# Patient Record
Sex: Female | Born: 1955 | Race: White | Hispanic: No | Smoking: Current every day smoker
Health system: Southern US, Community
[De-identification: ages and names within clinical notes are randomized; demographics above are authoritative.]

## PROBLEM LIST (undated history)

## (undated) DIAGNOSIS — M1990 Unspecified osteoarthritis, unspecified site: Secondary | ICD-10-CM

## (undated) HISTORY — PX: NO PAST SURGERIES: SHX2092

---

## 2013-03-10 ENCOUNTER — Emergency Department (HOSPITAL_COMMUNITY): Payer: 59

## 2013-03-10 ENCOUNTER — Observation Stay (HOSPITAL_COMMUNITY)
Admission: EM | Admit: 2013-03-10 | Discharge: 2013-03-11 | Disposition: A | Discharge status: Home / Self Care | Payer: 59 | Attending: Orthopaedic Surgery | Admitting: Orthopaedic Surgery

## 2013-03-10 ENCOUNTER — Encounter (HOSPITAL_COMMUNITY): Payer: Self-pay | Admitting: Emergency Medicine

## 2013-03-10 DIAGNOSIS — IMO0002 Reserved for concepts with insufficient information to code with codable children: Secondary | ICD-10-CM | POA: Insufficient documentation

## 2013-03-10 DIAGNOSIS — S82892A Other fracture of left lower leg, initial encounter for closed fracture: Secondary | ICD-10-CM | POA: Diagnosis present

## 2013-03-10 DIAGNOSIS — Y990 Civilian activity done for income or pay: Secondary | ICD-10-CM | POA: Insufficient documentation

## 2013-03-10 DIAGNOSIS — W010XXA Fall on same level from slipping, tripping and stumbling without subsequent striking against object, initial encounter: Secondary | ICD-10-CM | POA: Insufficient documentation

## 2013-03-10 DIAGNOSIS — F17200 Nicotine dependence, unspecified, uncomplicated: Secondary | ICD-10-CM | POA: Insufficient documentation

## 2013-03-10 DIAGNOSIS — S82843A Displaced bimalleolar fracture of unspecified lower leg, initial encounter for closed fracture: Principal | ICD-10-CM | POA: Insufficient documentation

## 2013-03-10 DIAGNOSIS — Y9289 Other specified places as the place of occurrence of the external cause: Secondary | ICD-10-CM | POA: Insufficient documentation

## 2013-03-10 HISTORY — DX: Unspecified osteoarthritis, unspecified site: M19.90

## 2013-03-10 MED ORDER — MORPHINE SULFATE 4 MG/ML IJ SOLN
6.0000 mg | Freq: Once | INTRAMUSCULAR | Status: AC
  Administered 2013-03-10: 6 mg via INTRAVENOUS

## 2013-03-10 MED ORDER — DIPHENHYDRAMINE HCL 25 MG PO CAPS: 25.0000 mg | ORAL_CAPSULE | Freq: Four times a day (QID) | ORAL | Status: DC | PRN

## 2013-03-10 MED ORDER — ETOMIDATE 2 MG/ML IV SOLN
INTRAVENOUS | Status: AC | PRN
  Administered 2013-03-10: 10 mg via INTRAVENOUS

## 2013-03-10 MED ORDER — ONDANSETRON HCL 4 MG/2ML IJ SOLN
INTRAMUSCULAR | Status: AC
  Administered 2013-03-10: 4 mg via INTRAVENOUS
  Filled 2013-03-10: qty 2

## 2013-03-10 MED ORDER — METHOCARBAMOL 500 MG PO TABS: 500.0000 mg | ORAL_TABLET | Freq: Three times a day (TID) | ORAL | Status: DC | PRN

## 2013-03-10 MED ORDER — MORPHINE SULFATE 4 MG/ML IJ SOLN
6.0000 mg | Freq: Once | INTRAMUSCULAR | Status: AC
  Administered 2013-03-10: 6 mg via INTRAVENOUS
  Filled 2013-03-10: qty 2

## 2013-03-10 MED ORDER — SODIUM CHLORIDE 0.9 % IV BOLUS (SEPSIS)
1000.0000 mL | Freq: Once | INTRAVENOUS | Status: AC
  Administered 2013-03-10: 1000 mL via INTRAVENOUS

## 2013-03-10 MED ORDER — MORPHINE SULFATE 4 MG/ML IJ SOLN
INTRAMUSCULAR | Status: AC
  Filled 2013-03-10: qty 1

## 2013-03-10 MED ORDER — ETOMIDATE 2 MG/ML IV SOLN
10.0000 mg | Freq: Once | INTRAVENOUS | Status: DC
  Filled 2013-03-10: qty 10

## 2013-03-10 MED ORDER — ONDANSETRON HCL 4 MG/2ML IJ SOLN
4.0000 mg | Freq: Once | INTRAMUSCULAR | Status: AC
  Administered 2013-03-10: 4 mg via INTRAVENOUS

## 2013-03-10 MED ORDER — MORPHINE SULFATE 4 MG/ML IJ SOLN: 4.0000 mg | Freq: Once | INTRAMUSCULAR | Status: DC

## 2013-03-10 MED ORDER — BUPIVACAINE HCL (PF) 0.5 % IJ SOLN
10.0000 mL | Freq: Once | INTRAMUSCULAR | Status: AC
  Administered 2013-03-10: 10 mL
  Filled 2013-03-10: qty 30

## 2013-03-10 MED ORDER — ASPIRIN 325 MG PO TABS
325.0000 mg | ORAL_TABLET | Freq: Two times a day (BID) | ORAL | Status: DC
  Administered 2013-03-10 – 2013-03-11 (×2): 325 mg via ORAL
  Filled 2013-03-10 (×3): qty 1

## 2013-03-10 MED ORDER — FENTANYL CITRATE 0.05 MG/ML IJ SOLN
50.0000 ug | Freq: Once | INTRAMUSCULAR | Status: DC
  Filled 2013-03-10: qty 2

## 2013-03-10 MED ORDER — MORPHINE SULFATE 4 MG/ML IJ SOLN
4.0000 mg | INTRAMUSCULAR | Status: AC
  Administered 2013-03-10: 4 mg via INTRAVENOUS
  Filled 2013-03-10: qty 1

## 2013-03-10 MED ORDER — PROPOFOL 10 MG/ML IV BOLUS
0.5000 mg/kg | Freq: Once | INTRAVENOUS | Status: DC
  Filled 2013-03-10: qty 1

## 2013-03-10 MED ORDER — MORPHINE SULFATE 4 MG/ML IJ SOLN
4.0000 mg | Freq: Once | INTRAMUSCULAR | Status: DC
  Filled 2013-03-10: qty 1

## 2013-03-10 MED ORDER — MORPHINE SULFATE 4 MG/ML IJ SOLN
4.0000 mg | Freq: Once | INTRAMUSCULAR | Status: AC
  Administered 2013-03-10: 4 mg via INTRAVENOUS
  Filled 2013-03-10: qty 1

## 2013-03-10 MED ORDER — OXYCODONE-ACETAMINOPHEN 5-325 MG PO TABS
1.0000 | ORAL_TABLET | ORAL | Status: DC | PRN
  Administered 2013-03-11 (×2): 2 via ORAL
  Filled 2013-03-10 (×2): qty 2

## 2013-03-10 MED ORDER — MORPHINE SULFATE 2 MG/ML IJ SOLN: 2.0000 mg | INTRAMUSCULAR | Status: DC | PRN

## 2013-03-10 NOTE — ED Notes (Signed)
Radiology notified DG ankle is portable, post split application. Ortho Tech at bedside at this time.

## 2013-03-10 NOTE — ED Provider Notes (Signed)
CSN: [id number]     Arrival date & time 03/10/13  1540 History   First MD Initiated Contact with Patient 03/10/13 1543     Chief Complaint  Patient presents with  . Ankle Pain     (Consider location/radiation/quality/duration/timing/severity/associated sxs/prior Treatment) HPI Patient tripped and fell walking all work nearly prior to coming here injuring her left ankle. She also suffered an abrasion to her left hand as result fall. She was brought by EMS. Left lower extremity splinted by EMS and she was treated with fentanyl 100 mg intravenously prior to coming here. Presently pain is mild to moderate. Worse with movement improved with remains still. No other injury. No other associated symptoms. History reviewed. No pertinent past medical history. past medical history negative History reviewed. No pertinent past surgical history. No family history on file. History  Substance Use Topics  . Smoking status: Current Every Day Smoker  . Smokeless tobacco: Not on file  . Alcohol Use: Yes   no illicit drug use OB History   Grav Para Term Preterm Abortions TAB SAB Ect Mult Living                 Review of Systems  Constitutional: Negative.   HENT: Negative.   Respiratory: Negative.   Cardiovascular: Negative.   Gastrointestinal: Negative.   Musculoskeletal: Positive for joint swelling.       Pain at left ankle  Skin: Positive for wound.       Abrasion to left hand  Neurological: Negative.   Psychiatric/Behavioral: Negative.   All other systems reviewed and are negative.      Allergies  Review of patient's allergies indicates no known allergies.  Home Medications   Current Outpatient Rx  Name  Route  Sig  Dispense  Refill  . acetaminophen (TYLENOL) 325 MG tablet   Oral   Take 650 mg by mouth every 6 (six) hours as needed (pain).         [name]. loperamide (IMODIUM) 2 MG capsule   Oral   Take 2 mg by mouth as needed for diarrhea or loose stools (loose stools).           BP 140/72  Pulse 89  Temp(Src) 98.1 F (36.7 C) (Oral)  Resp 16  Ht 5\' 1"  (1.549 m)  Wt 125 lb (56.7 kg)  BMI 23.63 kg/m2  SpO2 95% Physical Exam  Nursing note and vitals reviewed. Constitutional: She appears well-developed and well-nourished.  HENT:  Head: Normocephalic and atraumatic.  Eyes: Conjunctivae are normal. Pupils are equal, round, and reactive to light.  Neck: Neck supple. No tracheal deviation present. No thyromegaly present.  Cardiovascular: Normal rate and regular rhythm.   No murmur heard. Pulmonary/Chest: Effort normal and breath sounds normal.  Abdominal: Soft. Bowel sounds are normal. She exhibits no distension. There is no tenderness.  Musculoskeletal: Normal range of motion. She exhibits no edema and no tenderness.  Neurological: She is alert. Coordination normal.  Skin: Skin is warm and dry. No rash noted.  Left upper extremity 3 cm abrasion to dorsum of left hand. No deformity no swelling no bony tenderness full range of motion neurovascular intact. Left lower sclerae skin is intact. Deformity ankle. With corresponding tenderness. DP pulse 2+ good capillary refill. All other extremities or contusion abrasion or tenderness neurovascularly intact. Entire spine nontender.  Psychiatric: She has a normal mood and affect.    ED Course  Procedures (including critical care time) Labs Review Labs Reviewed - No data to display  Imaging Review No results found. No results found for this or any previous visit. Dg Ankle Complete Left  03/10/2013   CLINICAL DATA:  Ankle pain status post fall.  EXAM: LEFT ANKLE COMPLETE - 3+ VIEW  COMPARISON:  None.  FINDINGS: The patient has sustained an acute fracture through the medial malleolus and through the fibular metadiaphysis. There is disruption of the ankle joint mortise. The medial malleolar fracture fragment is distracted from the donor site by approximately 10 mm. There is angulation of the comminuted spiral fracture of  the distal left fibular metadiaphysis. The tailor dome appears intact. No calcaneal fracture is demonstrated. The metatarsal bases appear intact.  IMPRESSION: There is a fracture dislocation of the left ankle involving the medial malleolus and the metadiaphysis of the distal fibula. No posterior malleolar fracture is demonstrated. Disruption of the ankle joint mortise is present.   Electronically Signed   By: David  [place]   On: 03/10/2013 16:43   Dg Ankle Left Port  03/10/2013   CLINICAL DATA:  Fracture.  EXAM: PORTABLE LEFT ANKLE - 2 VIEW  COMPARISON:  DG ANKLE*L*PORT dated 03/10/2013; DG ANKLE COMPLETE*L* dated 03/10/2013  FINDINGS: Again noted are medial malleolar and distal left fibular fractures. Patient status postreduction. There is interim better alignment of the medial malleolar and distal fibular fractures. These fractures remains slightly displaced. Tibiotalar alignment is noted.  IMPRESSION: Postreduction of medial malleolar and distal fibular fractures. Significant improvement in alignment noted on this exam . Tibiotalar joint is aligned on this exam.   Electronically Signed   By: Maisie Fus  Register   On: 03/10/2013 20:15   Dg Ankle Left Port  03/10/2013   CLINICAL DATA:  Left ankle fracture, postreduction.  EXAM: PORTABLE LEFT ANKLE - 2 VIEW  COMPARISON:  Insert  FINDINGS: Patient status post reduction of medial malleolar and distal fibular shaft fractures fracture fragments are severely displaced and angulated. The tibiotalar joint is severely disrupted. [name]  IMPRESSION: Patient status post reduction of the previously identified medial malleolar and distal fibular fractures. Fracture fragments remain severely displaced and angulated. The tibiotalar joint space is disrupted.   Electronically Signed   By: Maisie Fus  Register   On: 03/10/2013 19:10    EKG Interpretation   None      procedure 5:27 PM left ankle closed reduction performed by me. Consent was obtained. Risks and benefits explained.  Risks included aspiration and hypoxia. Benefits include decreased pain during procedure. Timeout performed. She was premedicated with etomidate 10 mg IV and morphine 4 mg IV. Close reduction was performed using traction. She is placed in a well-padded plaster stirrup splint by the orthopedic technician. DP pulse 2+ after reduction. At 5:35 PM she was fully awake [place] to come score 15. No complications. Post reduction films obtained.show inadequate reduction. Dr. Magnus Ivan called to consult on case as pr will require further reduction attempt. Pain adeqautelty controlled at 635 pm Ankle fracture dislocation was subsequently reduced with improvement by physician assistant Dr. Magnus Ivan who made arrangements for inpatient stay MDM   Final diagnoses:  None    Patient will be admitted by orthopedic service Diagnosis closed fracture dislocation of left ankle    Doug Sou, MD 03/10/13 2055

## 2013-03-10 NOTE — ED Notes (Signed)
Bed: [id number] Expected date:  Expected time:  Means of arrival:  Comments: EMS-fall-ankle injury

## 2013-03-10 NOTE — H&P (Signed)
I have seen and examined Christine Swanson and she understands that she will eventually need surgical fixation of her fracture left ankle once the soft-tissue swelling has subsided.

## 2013-03-10 NOTE — ED Notes (Signed)
Ortho tech requested to bedside  

## 2013-03-10 NOTE — ED Notes (Signed)
Patient ok to eat/drink until midnight per Albertine GratesG. Clark, PA

## 2013-03-10 NOTE — H&P (Signed)
Christine GaulDebra Swanson is an 58 y.o. female.   Chief Complaint: Left ankle fracture HPI: 58 year old female who fell will on job site earlier today. Positive for deformity of left ankle and unable to bear weight. No LOC, dizziness or other injury.  Past Medical History  Diagnosis Date  . Arthritis     Past Surgical History  Procedure Laterality Date  . No past surgeries      History reviewed. No pertinent family history. Social History:  reports that she has been smoking.  She has never used smokeless tobacco. She reports that she drinks alcohol. She reports that she does not use illicit drugs.  Allergies: No Known Allergies   (Not in a hospital admission)  No results found for this or any previous visit (from the past 48 hour(s)). Dg Ankle Complete Left  03/10/2013   CLINICAL DATA:  Ankle pain status post fall.  EXAM: LEFT ANKLE COMPLETE - 3+ VIEW  COMPARISON:  None.  FINDINGS: The patient has sustained an acute fracture through the medial malleolus and through the fibular metadiaphysis. There is disruption of the ankle joint mortise. The medial malleolar fracture fragment is distracted from the donor site by approximately 10 mm. There is angulation of the comminuted spiral fracture of the distal left fibular metadiaphysis. The tailor dome appears intact. No calcaneal fracture is demonstrated. The metatarsal bases appear intact.  IMPRESSION: There is a fracture dislocation of the left ankle involving the medial malleolus and the metadiaphysis of the distal fibula. No posterior malleolar fracture is demonstrated. Disruption of the ankle joint mortise is present.   Electronically Signed   By: David  [place]   On: 03/10/2013 16:43   Dg Ankle Left Port  03/10/2013   CLINICAL DATA:  Fracture.  EXAM: PORTABLE LEFT ANKLE - 2 VIEW  COMPARISON:  DG ANKLE*L*PORT dated 03/10/2013; DG ANKLE COMPLETE*L* dated 03/10/2013  FINDINGS: Again noted are medial malleolar and distal left fibular fractures. Patient status  postreduction. There is interim better alignment of the medial malleolar and distal fibular fractures. These fractures remains slightly displaced. Tibiotalar alignment is noted.  IMPRESSION: Postreduction of medial malleolar and distal fibular fractures. Significant improvement in alignment noted on this exam . Tibiotalar joint is aligned on this exam.   Electronically Signed   By: Maisie Fushomas  Register   On: 03/10/2013 20:15   Dg Ankle Left Port  03/10/2013   CLINICAL DATA:  Left ankle fracture, postreduction.  EXAM: PORTABLE LEFT ANKLE - 2 VIEW  COMPARISON:  Insert  FINDINGS: Patient status post reduction of medial malleolar and distal fibular shaft fractures fracture fragments are severely displaced and angulated. The tibiotalar joint is severely disrupted. [name].  IMPRESSION: Patient status post reduction of the previously identified medial malleolar and distal fibular fractures. Fracture fragments remain severely displaced and angulated. The tibiotalar joint space is disrupted.   Electronically Signed   By: Maisie Fushomas  Register   On: 03/10/2013 19:10    Review of Systems  Constitutional: Negative.   HENT: Negative.   Respiratory:       Chronic bronchitis  Cardiovascular: Negative.   Gastrointestinal: Negative.   Musculoskeletal:       Left ankle pain   Skin: Negative.   Neurological: Negative.   Psychiatric/Behavioral: Negative.     Blood pressure 126/69, pulse 91, temperature 98.1 F (36.7 C), temperature source Oral, resp. rate 24, height 5\' 1"  (1.549 m), weight 56.7 kg (125 lb), SpO2 91.00%. Physical Exam  Constitutional: She is oriented to person, place, and  time. She appears well-developed and well-nourished.  HENT:  Head: Normocephalic and atraumatic.  Eyes: EOM are normal.  Cardiovascular: Normal rate, regular rhythm and intact distal pulses.   Respiratory: Effort normal.  Musculoskeletal:  Deformity of left ankle with ecchymosis. Non tender left proximal tib/fib. Calf supple and non  tender.  Neurological: She is alert and oriented to person, place, and time.  Skin: Skin is warm and dry.  Psychiatric: She has a normal mood and affect.     Assessment/Plan Closed Bimalleolar  Left ankle fracture with disruption of the ankle mortise. Closed reduction performed after ankle block with 3cc Marcaine plain. Posterior splint wit stirrup applied.  Will admit for pain management.  Will require surgical intervention as out patient once swelling subsides.  Richardean Canal 03/10/2013, 8:38 PM

## 2013-03-10 NOTE — ED Notes (Signed)
Ortho PA to bedside to re-examine pt's ankle

## 2013-03-10 NOTE — Progress Notes (Signed)
   CARE MANAGEMENT ED NOTE 03/10/2013  Patient:  Christine Swanson,Christine Swanson   Account Number:  [id number]  Date Initiated:  03/10/2013  Documentation initiated by:  Edd ArbourGIBBS,KIMBERLY  Subjective/Objective Assessment:   58 yr old female [facility] choice [facility] from [place], visiting [place] per pt who states her pcp is in [place]     Subjective/Objective Assessment Detail:   Pt on her cell phone when ED CM spoke with her, pt did not provide CM with a pcp name  Prefers to follow up with her pcp in [place]     Action/Plan:   CM spoke with pt   Action/Plan Detail:   Anticipated DC Date:  03/10/2013     Status Recommendation to Physician:   Result of Recommendation:    Other ED Services  Consult Working Plan    DC Planning Services  Other  PCP issues  Other    Choice offered to / List presented to:            Status of service:  Completed, signed off  ED Comments:   ED Comments Detail:

## 2013-03-10 NOTE — ED Notes (Signed)
Arrives via [facility], was working outside and had mechanical fall with left ankle deformity and pain, CMS intact, no LOC, no neck/back pain or other complaints, VSS, A/O X4, 20g left hand, EMS gave Fentanyl pta, NAD

## 2013-03-10 NOTE — ED Notes (Signed)
Called x-ray to expedite pt's films

## 2013-03-11 MED ORDER — OXYCODONE-ACETAMINOPHEN 5-325 MG PO TABS: 1.0000 | ORAL_TABLET | ORAL | Status: AC | PRN

## 2013-03-11 MED ORDER — ASPIRIN 325 MG PO TABS: 325.0000 mg | ORAL_TABLET | Freq: Two times a day (BID) | ORAL | Status: AC

## 2013-03-11 NOTE — Discharge Summary (Signed)
Patient ID: Christine Swanson MRN: [id number] DOB/AGE: 60-Aug-1957 49 y.o.  Admit date: 03/10/2013 Discharge date: 03/11/2013  Admission Diagnoses:  Active Problems:   Ankle fracture, left   Discharge Diagnoses:  Same  Past Medical History  Diagnosis Date  . Arthritis     Surgeries:  on * No surgery found *   Consultants:    Discharged Condition: Improved  Hospital Course: Christine Swanson is an 58 y.o. female who was admitted 03/10/2013 for operative treatment of<principal problem not specified>. Patient has severe unremitting pain that affects sleep, daily activities, and work/hobbies. After pre-op clearance the patient was taken to the operating room on * No surgery found * and underwent  .    Patient was given perioperative antibiotics: Anti-infectives   None       Patient was given sequential compression devices, early ambulation, and chemoprophylaxis to prevent DVT.  Patient benefited maximally from hospital stay and there were no complications.    Recent vital signs: Patient Vitals for the past 24 hrs:  BP Temp Temp src Pulse Resp SpO2 Height Weight  03/11/13 0624 110/68 mmHg 98.3 F (36.8 C) Oral 77 18 97 % - -  03/10/13 2141 127/84 mmHg 97.8 F (36.6 C) Oral 81 18 - - -  03/10/13 2101 - - - - - 95 % - -  03/10/13 2100 118/67 mmHg - - 81 - 90 % - -  03/10/13 2030 142/76 mmHg - - 90 16 99 % - -  03/10/13 2000 99/65 mmHg - - 84 10 94 % - -  03/10/13 1930 136/70 mmHg - - 89 14 89 % - -  03/10/13 1900 119/66 mmHg - - 87 15 97 % - -  03/10/13 1830 126/69 mmHg - - 91 24 91 % - -  03/10/13 1815 124/92 mmHg - - 95 16 95 % - -  03/10/13 1800 103/86 mmHg - - 87 17 99 % - -  03/10/13 1740 138/77 mmHg - - 97 21 99 % - -  03/10/13 1736 116/94 mmHg - - 107 34 98 % - -  03/10/13 1730 135/94 mmHg - - 91 15 97 % - -  03/10/13 1729 127/87 mmHg - - 82 10 97 % - -  03/10/13 1726 124/77 mmHg - - 83 16 99 % - -  03/10/13 1725 132/72 mmHg - - 87 10 98 % - -  03/10/13 1718 148/67 mmHg  - - 79 16 99 % - -  03/10/13 1542 140/72 mmHg 98.1 F (36.7 C) Oral 89 16 95 % 5\' 1"  (1.549 m) 56.7 kg (125 lb)     Recent laboratory studies: No results found for this basename: WBC, HGB, HCT, PLT, NA, K, CL, CO2, BUN, CREATININE, GLUCOSE, PT, INR, CALCIUM, 2,  in the last 72 hours   Discharge Medications:     Medication List    STOP taking these medications       acetaminophen 325 MG tablet  Commonly known as:  TYLENOL      TAKE these medications       aspirin 325 MG tablet  Take 1 tablet (325 mg total) by mouth 2 (two) times daily.     loperamide 2 MG capsule  Commonly known as:  IMODIUM  Take 2 mg by mouth as needed for diarrhea or loose stools (loose stools).     oxyCODONE-acetaminophen 5-325 MG per tablet  Commonly known as:  PERCOCET/ROXICET  Take 1-2 tablets by mouth every 4 (four) hours as needed  for moderate pain.        Diagnostic Studies: Dg Ankle Complete Left  Mar 15, 2013   CLINICAL DATA:  Ankle pain status post fall.  EXAM: LEFT ANKLE COMPLETE - 3+ VIEW  COMPARISON:  None.  FINDINGS: The patient has sustained an acute fracture through the medial malleolus and through the fibular metadiaphysis. There is disruption of the ankle joint mortise. The medial malleolar fracture fragment is distracted from the donor site by approximately 10 mm. There is angulation of the comminuted spiral fracture of the distal left fibular metadiaphysis. The tailor dome appears intact. No calcaneal fracture is demonstrated. The metatarsal bases appear intact.  IMPRESSION: There is a fracture dislocation of the left ankle involving the medial malleolus and the metadiaphysis of the distal fibula. No posterior malleolar fracture is demonstrated. Disruption of the ankle joint mortise is present.   Electronically Signed   By: Christine  Swanson   On: March 15, 2013 16:43   Dg Ankle Left Port  March 15, 2013   CLINICAL DATA:  Fracture.  EXAM: PORTABLE LEFT ANKLE - 2 VIEW  COMPARISON:  DG ANKLE*L*PORT dated  03/15/2013; DG ANKLE COMPLETE*L* dated 03/15/2013  FINDINGS: Again noted are medial malleolar and distal left fibular fractures. Patient status postreduction. There is interim better alignment of the medial malleolar and distal fibular fractures. These fractures remains slightly displaced. Tibiotalar alignment is noted.  IMPRESSION: Postreduction of medial malleolar and distal fibular fractures. Significant improvement in alignment noted on this exam . Tibiotalar joint is aligned on this exam.   Electronically Signed   By: Maisie Fus  Register   On: 03/15/13 20:15   Dg Ankle Left Port  March 15, 2013   CLINICAL DATA:  Left ankle fracture, postreduction.  EXAM: PORTABLE LEFT ANKLE - 2 VIEW  COMPARISON:  Insert  FINDINGS: Patient status post reduction of medial malleolar and distal fibular shaft fractures fracture fragments are severely displaced and angulated. The tibiotalar joint is severely disrupted. Christine Swanson  IMPRESSION: Patient status post reduction of the previously identified medial malleolar and distal fibular fractures. Fracture fragments remain severely displaced and angulated. The tibiotalar joint space is disrupted.   Electronically Signed   By: Maisie Fus  Register   On: 03-15-2013 19:10    Disposition:  To home      Discharge Orders   Future Orders Complete By Expires   Call MD / Call 911  As directed    Comments:     If you experience chest pain or shortness of breath, CALL 911 and be transported to the hospital emergency room.  If you develope a fever above 101 F, pus (white drainage) or increased drainage or redness at the wound, or calf pain, call your surgeon's office.   Constipation Prevention  As directed    Comments:     Drink plenty of fluids.  Prune juice may be helpful.  You may use a stool softener, such as Colace (over the counter) 100 mg twice a day.  Use MiraLax (over the counter) for constipation as needed.   Diet - low sodium heart healthy  As directed    Discharge instructions  As  directed    Comments:     No weight bearing on your left ankle at all until further notice. Ice and elevation for swelling. Will need to contact your local Orthopedic Surgeon in [place] due to the need for surgery on your broken ankle. Keep your splint clean and dry.   Discharge patient  As directed    Increase activity slowly as tolerated  As directed       Follow-up Information   Follow up with local Orthopedic Physician. Schedule an appointment as soon as possible for a visit in 1 week.       SignedKathryne Hitch: Aubreyanna Dorrough Y 03/11/2013, 7:47 AM

## 2013-03-11 NOTE — Progress Notes (Signed)
Pt d/c home. Lives in [place]. Understands to follow up with an orthopedic surgeon when she returns home. AVS reviewed and pain medications explained. RW and CD of x-rays delivered to patient before d/c.

## 2013-03-11 NOTE — Evaluation (Signed)
Physical Therapy Evaluation Patient Details Name: Christine Swanson MRN: [id number] DOB: 08/04/1955 Today's Date: 03/11/2013 Time: 4098-1191 PT Time Calculation (min): 15 min  PT Assessment / Plan / Recommendation History of Present Illness  Pt admitted after fall working and post-closed reduction and splinting of her L ankle fracture.  Clinical Impression  Pt currently with functional limitations due to the deficits listed below (see PT Problem List).  Pt will benefit from skilled PT to increase their independence and safety with mobility to allow discharge to the venue listed below.  Pt is from [place] and here working when she fell and sustained L ankle fx.  Pt plans to d/c to hotel room and then return to [place] with family.  Pt states she has a flight of indoor stairs to get into her home however plans to scoot up on her bottom and states son will be there to assist her.  Pt declined practicing stairs.  Pt will need youth RW prior to d/c.  Per chart likely d/c today however if remains will see in acute.  Pt to f/u with orthopaedics for surgery upon return home so no HHPT at this time.     PT Assessment  Patient needs continued PT services    Follow Up Recommendations  No PT follow up    Does the patient have the potential to tolerate intense rehabilitation      Barriers to Discharge        Equipment Recommendations  Rolling walker with 5" wheels (youth)    Recommendations for Other Services     Frequency Min 3X/week    Precautions / Restrictions Precautions Precautions: Fall Restrictions Weight Bearing Restrictions: Yes LLE Weight Bearing: Non weight bearing   Pertinent Vitals/Pain Pt c/o max L ankle pain, premedicated hr prior to therapy, elevated L LE on pillows      Mobility  Bed Mobility Overal bed mobility: Modified Independent Transfers Overall transfer level: Needs assistance Equipment used: Rolling walker (2 wheeled) Transfers: Sit to/from Stand Sit to Stand:  Supervision General transfer comment: verbal cues for safety, ie waiting for RW and taking RW to back up to bed, vc for hand placement Ambulation/Gait Ambulation/Gait assistance: Min guard;Supervision Ambulation Distance (Feet): 100 Feet Assistive device: Rolling walker (2 wheeled) General Gait Details: pt reports feeling more comfortable with RW so did not use crutches per pt preference, does well maintaining NWB, verbal cues for RW distance    Exercises     PT Diagnosis: Difficulty walking;Acute pain  PT Problem List: Decreased mobility;Decreased knowledge of use of DME PT Treatment Interventions: DME instruction;Gait training;Functional mobility training;Stair training;Therapeutic activities;Therapeutic exercise;Patient/family education     PT Goals(Current goals can be found in the care plan section) Acute Rehab PT Goals PT Goal Formulation: With patient Time For Goal Achievement: 03/15/13 Potential to Achieve Goals: Good  Visit Information  Last PT Received On: 03/11/13 Assistance Needed: +1 History of Present Illness: Pt admitted after fall working and post-closed reduction and splinting of her L ankle fracture.       Prior Functioning  Home Living Family/patient expects to be discharged to:: Private residence Living Arrangements: Children (son) Home Equipment: None Additional Comments: Pt is in [place] working and from [employer].  Pt plans to d/c to hotel tonight and have family assist with drive home.   Prior Function Level of Independence: Independent Communication Communication: No difficulties    Cognition  Cognition Arousal/Alertness: Awake/alert Behavior During Therapy: WFL for tasks assessed/performed Overall Cognitive Status: Within Functional Limits for  tasks assessed    Extremity/Trunk Assessment Lower Extremity Assessment Lower Extremity Assessment: LLE deficits/detail LLE Deficits / Details: L ankle in splint so did not test, otherwise WFL   Balance    End of  Session PT - End of Session Activity Tolerance: Patient tolerated treatment well Patient left: in bed;with call bell/phone within reach  GP Functional Assessment Tool Used: clinical judgement Functional Limitation: Mobility: Walking and moving around Mobility: Walking and Moving Around Current Status ([id number]([id number]): At least 1 percent but less than 20 percent impaired, limited or restricted Mobility: Walking and Moving Around Goal Status [phone]([id number]): 0 percent impaired, limited or restricted   Freja Faro,KATHrine E 03/11/2013, 12:18 PM Zenovia JarredKati Taeja Debellis, PT, DPT 03/11/2013 Pager: [phone]

## 2013-03-11 NOTE — Progress Notes (Signed)
Utilization Review completed.  

## 2013-03-11 NOTE — Progress Notes (Signed)
Subjective:     Patient reports pain as moderate.   Much more comfortable post-closed reduction and splinting of her ankle fracture.  Objective: Vital signs in last 24 hours: Temp:  [97.8 F (36.6 C)-98.3 F (36.8 C)] 98.3 F (36.8 C) (02/21 0624) Pulse Rate:  [77-107] 77 (02/21 0624) Resp:  [10-34] 18 (02/21 0624) BP: (99-148)/(65-94) 110/68 mmHg (02/21 0624) SpO2:  [89 %-99 %] 97 % (02/21 0624) Weight:  [56.7 kg (125 lb)] 56.7 kg (125 lb) (02/20 1542)  Intake/Output from previous day: 02/20 0701 - 02/21 0700 In: 120 [P.O.:120] Out: 1000 [Urine:1000] Intake/Output this shift: Total I/O In: 360 [P.O.:360] Out: -   No results found for this basename: HGB,  in the last 72 hours No results found for this basename: WBC, RBC, HCT, PLT,  in the last 72 hours No results found for this basename: NA, K, CL, CO2, BUN, CREATININE, GLUCOSE, CALCIUM,  in the last 72 hours No results found for this basename: LABPT, INR,  in the last 72 hours  Sensation intact distally Intact pulses distally Incision: dressing C/D/I  Assessment/Plan:     Discharge to home today with elevation for swelling and non-weight bearing on left ankle. Will need surgery as an outpatient at a later date once soft-tissue has calmed down.  Kathryne HitchBLACKMAN,Christine Swanson Y 03/11/2013, 7:42 AM

## 2014-10-09 IMAGING — CR DG ANKLE PORT 2V*L*
1 series · 3 of 3 positions shown · non-contrast
Comparison: DG ANKLE*L*PORT dated 03/10/2013; DG ANKLE COMPLETE*L*
dated 03/10/2013

CLINICAL DATA: Fracture.

EXAM:
PORTABLE LEFT ANKLE - 2 VIEW

[Series 1: AP · left · 3 of 3 slices shown]
[im 1/3]
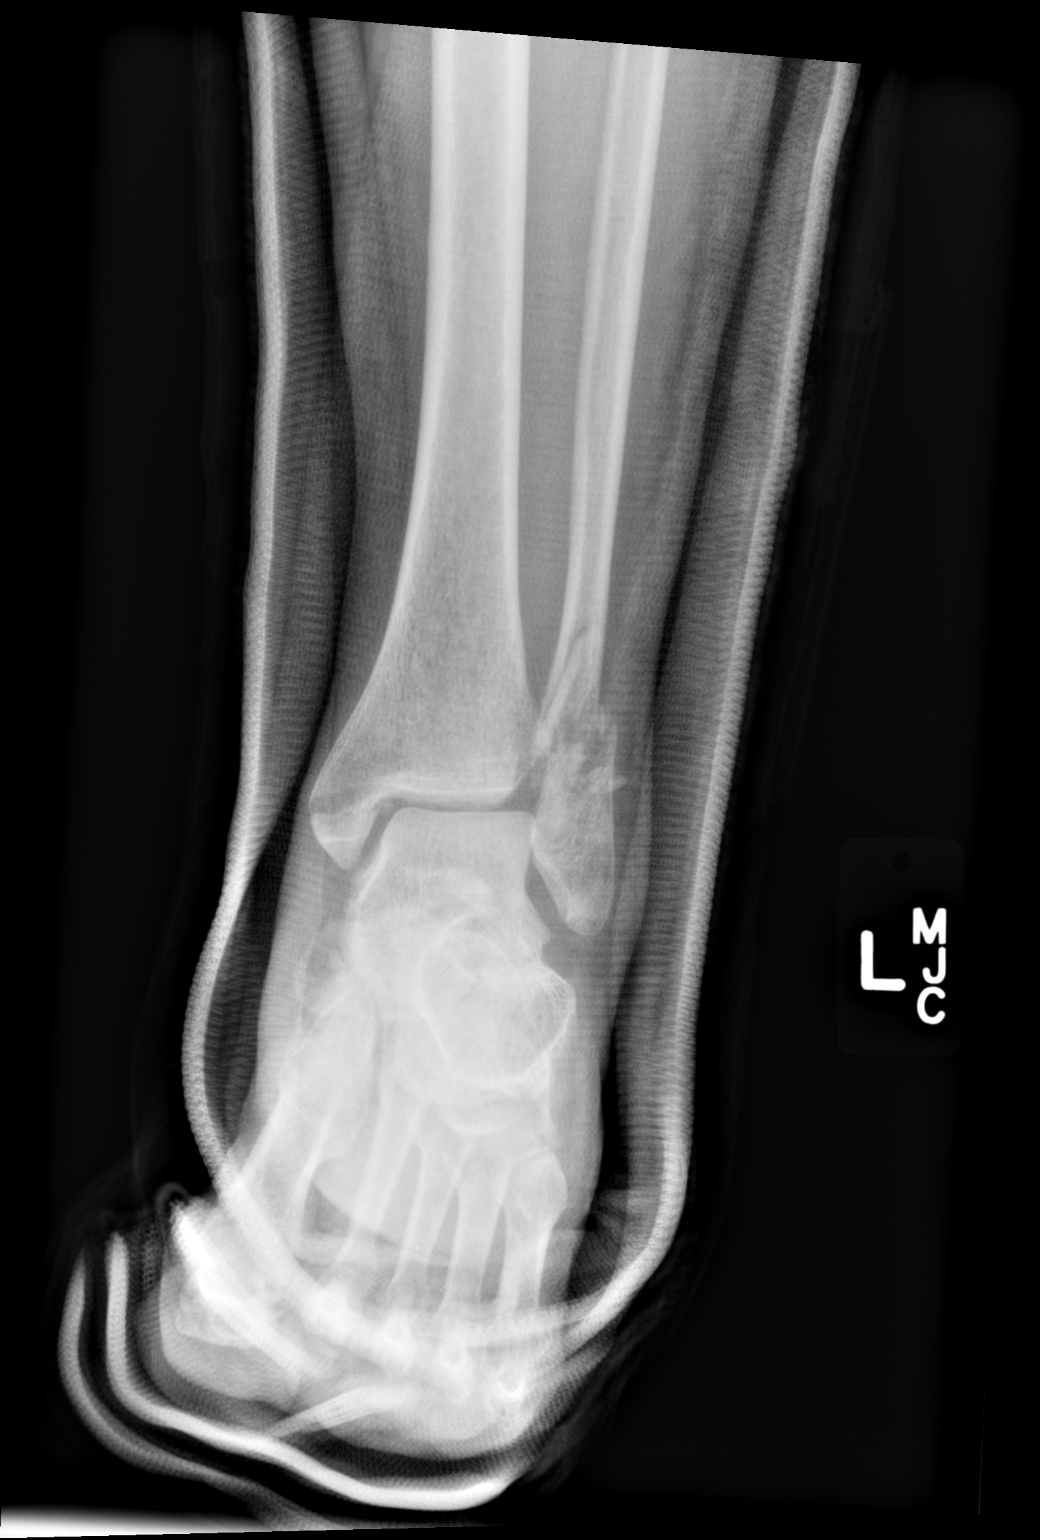
[im 2/3]
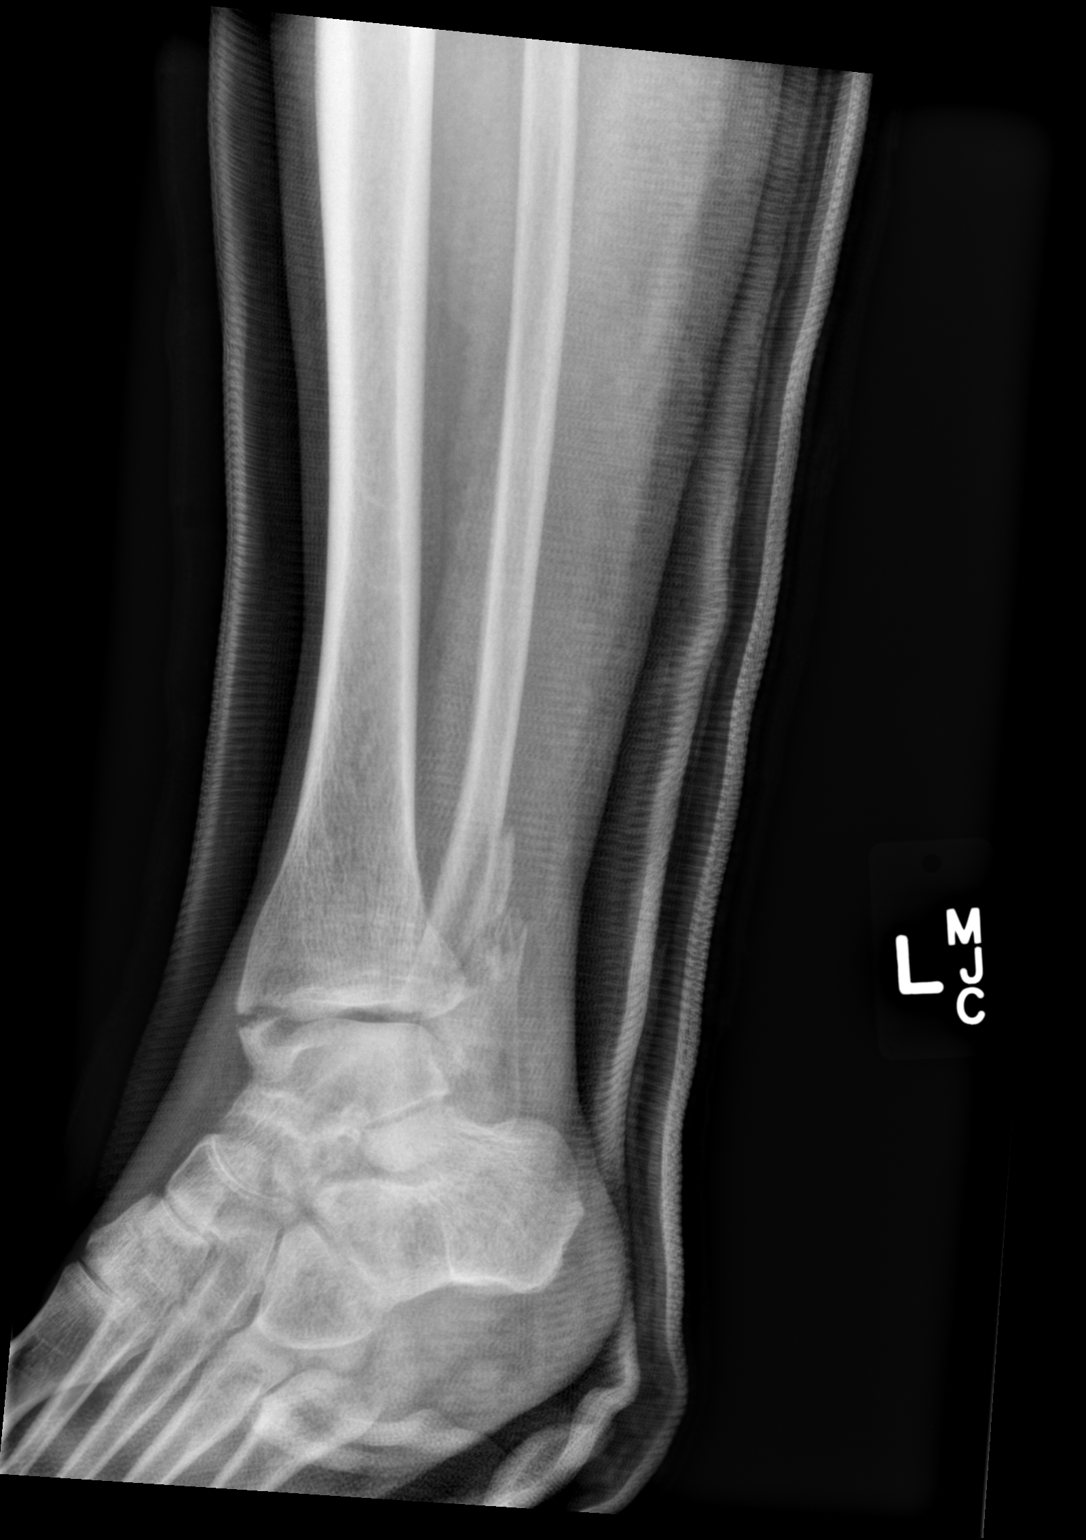
[im 3/3]
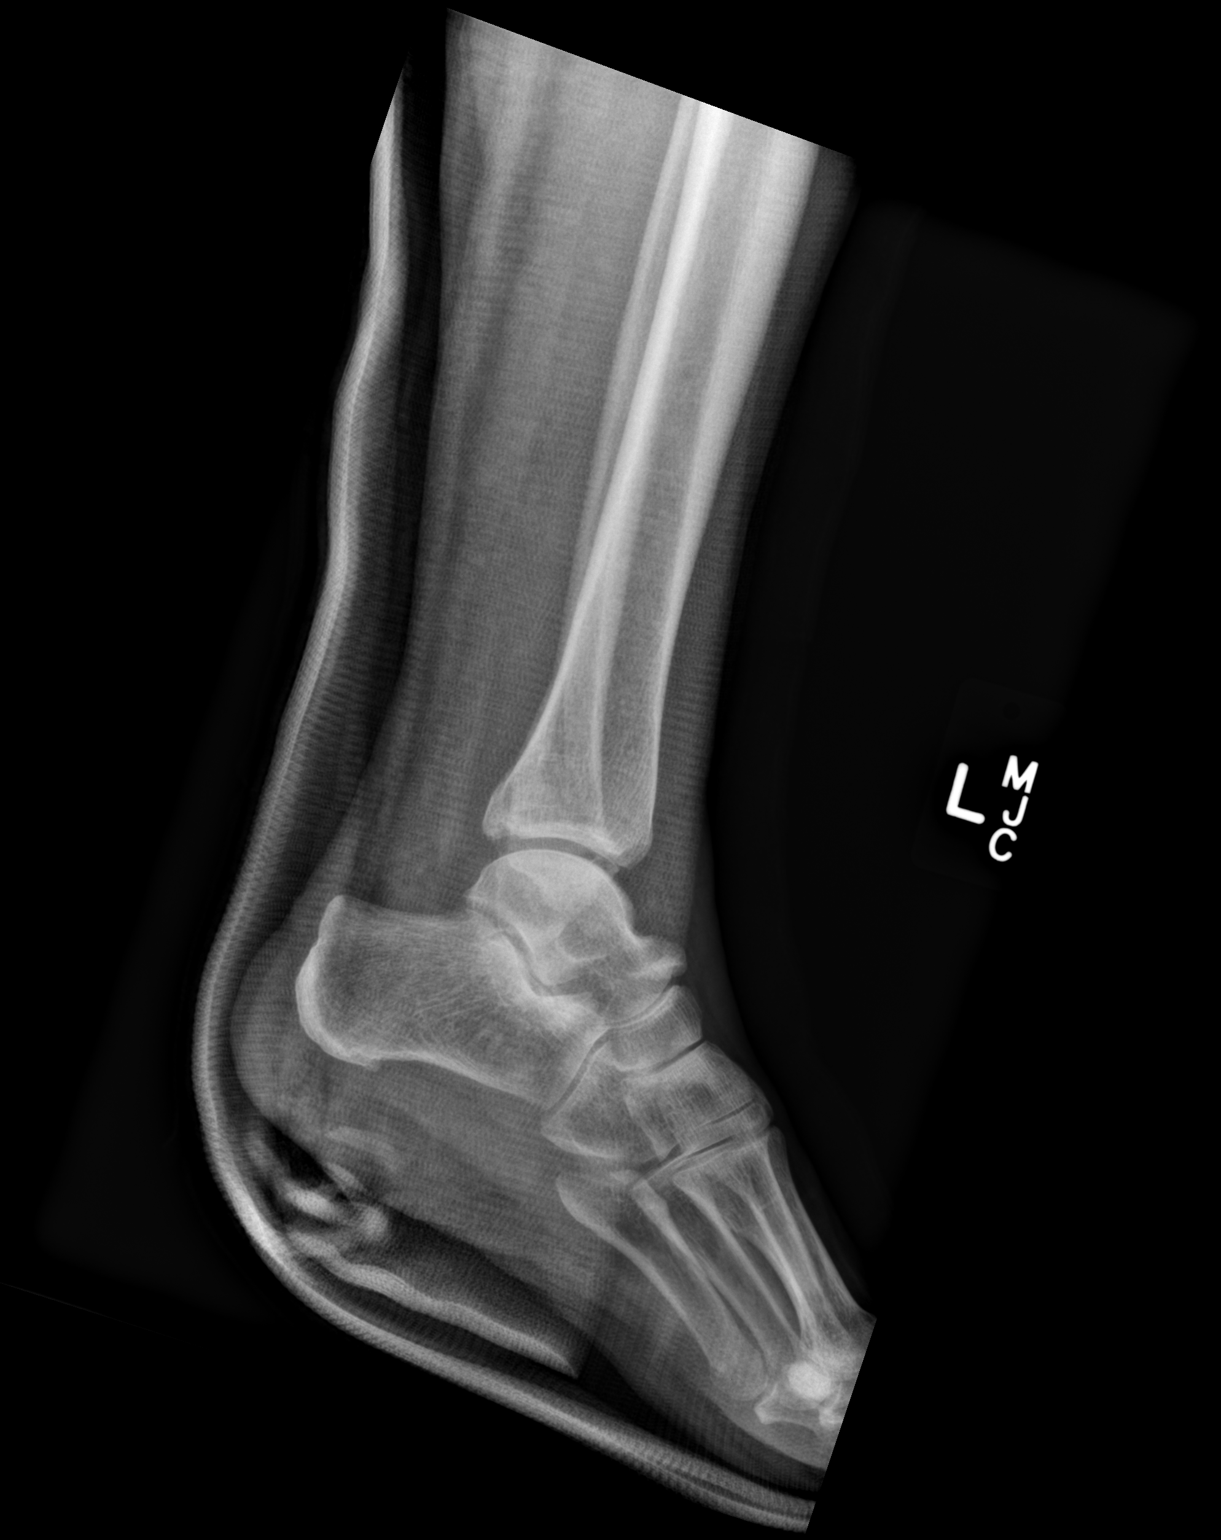

[3 of 3 positions shown; findings below may reference images not displayed]

FINDINGS: Again noted are medial malleolar and distal left fibular fractures.
Patient status postreduction. There is interim better alignment of
the medial malleolar and distal fibular fractures. These fractures
remains slightly displaced. Tibiotalar alignment is noted.
IMPRESSION: Postreduction of medial malleolar and distal fibular fractures.
Significant improvement in alignment noted on this exam . Tibiotalar
joint is aligned on this exam.
